# Patient Record
Sex: Female | Born: 1995 | Race: White | Hispanic: No | Marital: Single | Smoking: Current every day smoker
Health system: Southern US, Community
[De-identification: ages and names within clinical notes are randomized; demographics above are authoritative.]

---
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
## 2021-09-20 ENCOUNTER — Emergency Department (HOSPITAL_BASED_OUTPATIENT_CLINIC_OR_DEPARTMENT_OTHER)
Admission: EM | Admit: 2021-09-20 | Discharge: 2021-09-20 | Disposition: A | Discharge status: Home / Self Care | Payer: Self-pay | Attending: Emergency Medicine | Admitting: Emergency Medicine

## 2021-09-20 ENCOUNTER — Other Ambulatory Visit: Payer: Self-pay

## 2021-09-20 ENCOUNTER — Emergency Department (HOSPITAL_BASED_OUTPATIENT_CLINIC_OR_DEPARTMENT_OTHER): Payer: Self-pay

## 2021-09-20 ENCOUNTER — Encounter (HOSPITAL_BASED_OUTPATIENT_CLINIC_OR_DEPARTMENT_OTHER): Payer: Self-pay | Admitting: Emergency Medicine

## 2021-09-20 DIAGNOSIS — Z23 Encounter for immunization: Secondary | ICD-10-CM | POA: Insufficient documentation

## 2021-09-20 DIAGNOSIS — S61412A Laceration without foreign body of left hand, initial encounter: Secondary | ICD-10-CM | POA: Insufficient documentation

## 2021-09-20 DIAGNOSIS — Y990 Civilian activity done for income or pay: Secondary | ICD-10-CM | POA: Insufficient documentation

## 2021-09-20 DIAGNOSIS — W25XXXA Contact with sharp glass, initial encounter: Secondary | ICD-10-CM | POA: Insufficient documentation

## 2021-09-20 MED ORDER — TETANUS-DIPHTH-ACELL PERTUSSIS 5-2.5-18.5 LF-MCG/0.5 IM SUSY
0.5000 mL | PREFILLED_SYRINGE | Freq: Once | INTRAMUSCULAR | Status: AC
  Administered 2021-09-20: 0.5 mL via INTRAMUSCULAR
  Filled 2021-09-20: qty 0.5

## 2021-09-20 MED ORDER — CEPHALEXIN 500 MG PO CAPS: 500.0000 mg | ORAL_CAPSULE | Freq: Three times a day (TID) | ORAL | 0 refills | Status: AC

## 2021-09-20 MED ORDER — LIDOCAINE HCL (PF) 1 % IJ SOLN
30.0000 mL | Freq: Once | INTRAMUSCULAR | Status: AC
  Administered 2021-09-20: 30 mL
  Filled 2021-09-20: qty 30

## 2021-09-20 MED ORDER — IBUPROFEN 400 MG PO TABS
600.0000 mg | ORAL_TABLET | Freq: Once | ORAL | Status: AC
  Administered 2021-09-20: 600 mg via ORAL
  Filled 2021-09-20: qty 1

## 2021-09-20 NOTE — ED Triage Notes (Signed)
Laceration to LT hand with broken glass at work tonight

## 2021-09-20 NOTE — Discharge Instructions (Addendum)
Your x-ray did not show any bony injury, or foreign bodies.  Laceration was repaired with 2 stitches.  These will need to be removed in 7 days.  You can go to your primary care provider, or return to emergency room or any urgent care.  If you have any worsening swelling, drainage, redness, or fever please return for evaluation as these are concerning for infection.

## 2021-09-20 NOTE — ED Provider Notes (Signed)
[facility] EMERGENCY DEPARTMENT Provider Note   CSN: [id number] Arrival date & time: 09/20/21  1940     History  Chief Complaint  Patient presents with   Laceration    Latasha Henderson is a 26 y.o. female.  26 year old female presents today for evaluation of laceration to her left hand.  She states she was putting a glass on the top shelf at work.  The glass struck the metal shelf chest pain, and shattered in her hand.  Denies any other injuries.  She is unsure of her last tetanus shot.  Injury occurred around 6 PM.  The history is provided by the patient. No language interpreter was used.       Home Medications Prior to Admission medications   Not on File      Allergies    Patient has no known allergies.    Review of Systems   Review of Systems  Skin:  Positive for wound.  All other systems reviewed and are negative.   Physical Exam Updated Vital Signs BP 135/74 (BP Location: Right Arm)   Pulse 72   Temp 98.3 F (36.8 C) (Oral)   Resp 16   Ht 5\' 11"  (1.803 m)   Wt 83.9 kg   SpO2 100%   BMI 25.80 kg/m  Physical Exam Vitals and nursing note reviewed.  Constitutional:      General: She is not in acute distress.    Appearance: Normal appearance. She is not ill-appearing.  HENT:     Head: Normocephalic and atraumatic.     Nose: Nose normal.  Eyes:     Conjunctiva/sclera: Conjunctivae normal.  Pulmonary:     Effort: Pulmonary effort is normal. No respiratory distress.  Musculoskeletal:        General: No deformity.  Skin:    Findings: No rash.     Comments: About 1 cm laceration noted between the base of the fifth and the fourth digit on the left hand.  Active bleeding noted.  No foreign bodies noted.  2+ DP pulse present.  Full range of motion in all digits of the left hand.  Brisk cap refill present.  Neurological:     Mental Status: She is alert.     ED Results / Procedures / Treatments   Labs (all labs ordered are listed, but only  abnormal results are displayed) Labs Reviewed - No data to display  EKG None  Radiology DG Hand Complete Left  Result Date: 09/20/2021 CLINICAL DATA:  Left hand laceration EXAM: LEFT HAND - COMPLETE 3+ VIEW COMPARISON:  None Available. FINDINGS: There is no evidence of fracture or dislocation. There is no evidence of arthropathy or other focal bone abnormality. Soft tissues are unremarkable. No retained radiopaque foreign body identified. IMPRESSION: Negative. Electronically Signed   By: 11/20/2021 M.D.   On: 09/20/2021 20:05    Procedures Procedures    Medications Ordered in ED Medications  ibuprofen (ADVIL) tablet 600 mg (has no administration in time range)  lidocaine (PF) (XYLOCAINE) 1 % injection 30 mL (has no administration in time range)  Tdap (BOOSTRIX) injection 0.5 mL (has no administration in time range)    ED Course/ Medical Decision Making/ A&P                           Medical Decision Making Amount and/or Complexity of Data Reviewed Radiology: ordered.  Risk Prescription drug management.   26 year old female presents today for evaluation  of laceration to the left hand at the base of the fifth and fourth digit.  X-ray without evidence of foreign body.  Laceration thoroughly cleaned.  Repaired with 2 stitches.  Symptomatic management discussed.  Return precautions discussed.  Patient voices understanding and is in agreement with plan.  Patient is right-hand dominant.  Neurovascularly intact.   Final Clinical Impression(s) / ED Diagnoses Final diagnoses:  Laceration of left hand without foreign body, initial encounter    Rx / DC Orders ED Discharge Orders          Ordered    cephALEXin (KEFLEX) 500 MG capsule  3 times daily        09/20/21 2209              Marita [place], PA-C 09/20/21 2213    Terald Sleeper, MD 09/20/21 2237

## 2023-02-22 IMAGING — DX DG HAND COMPLETE 3+V*L*
3 series · 3 of 3 positions shown · non-contrast
Comparison: None Available.

CLINICAL DATA: Left hand laceration

EXAM:
LEFT HAND - COMPLETE 3+ VIEW

[hand pa]
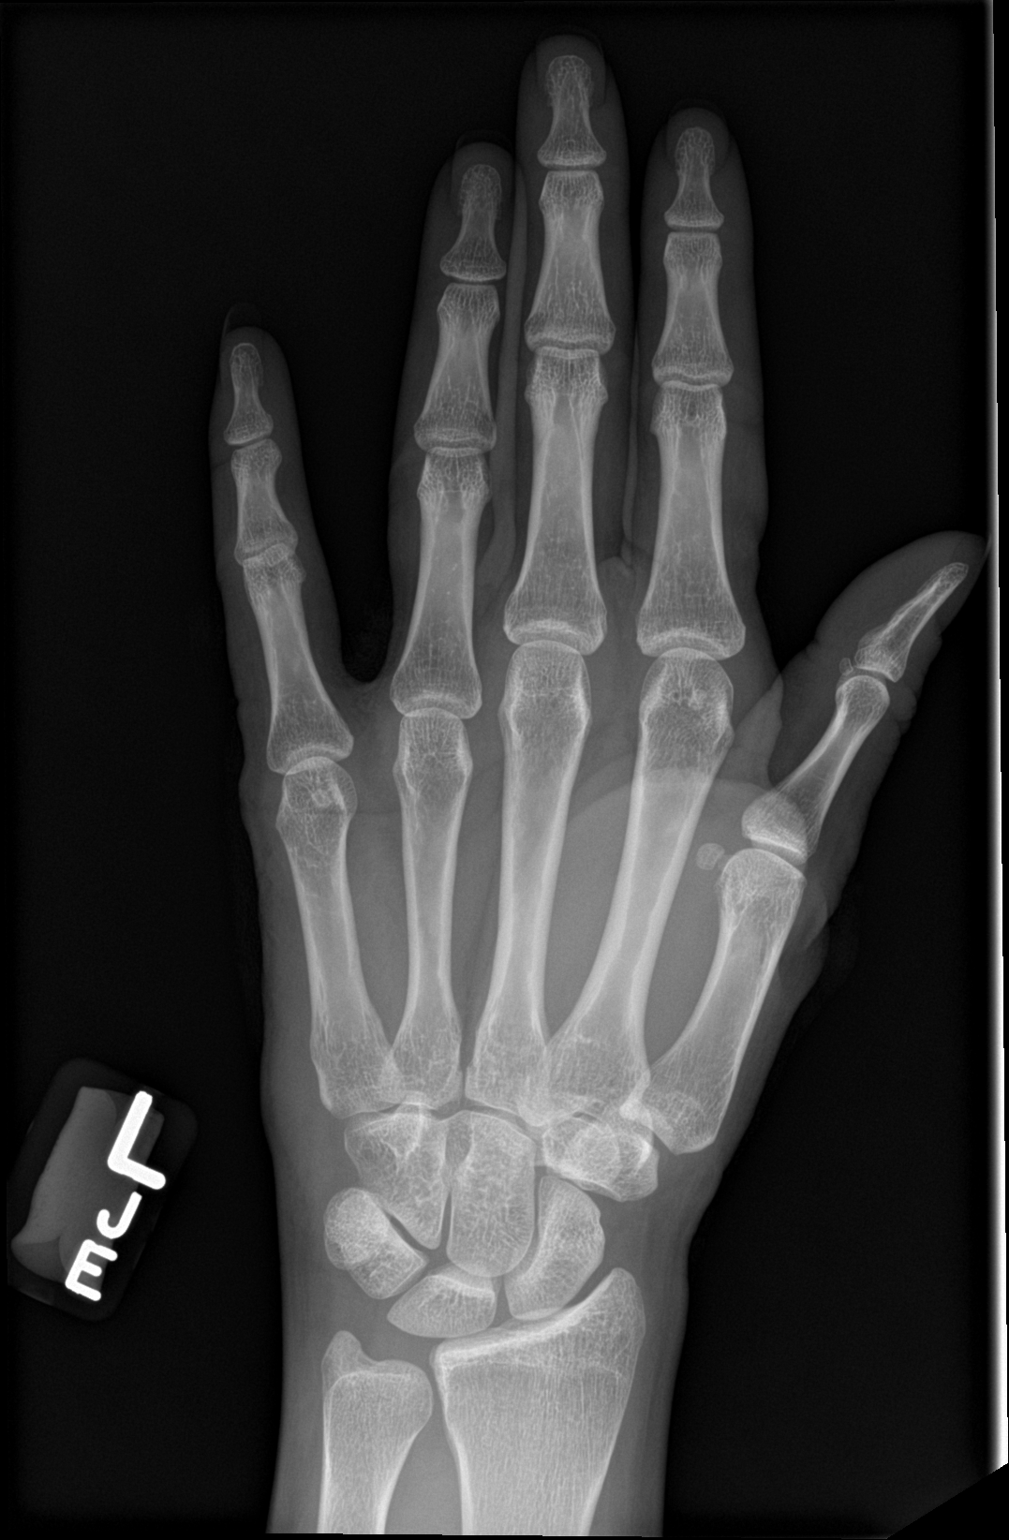

[hand obl]
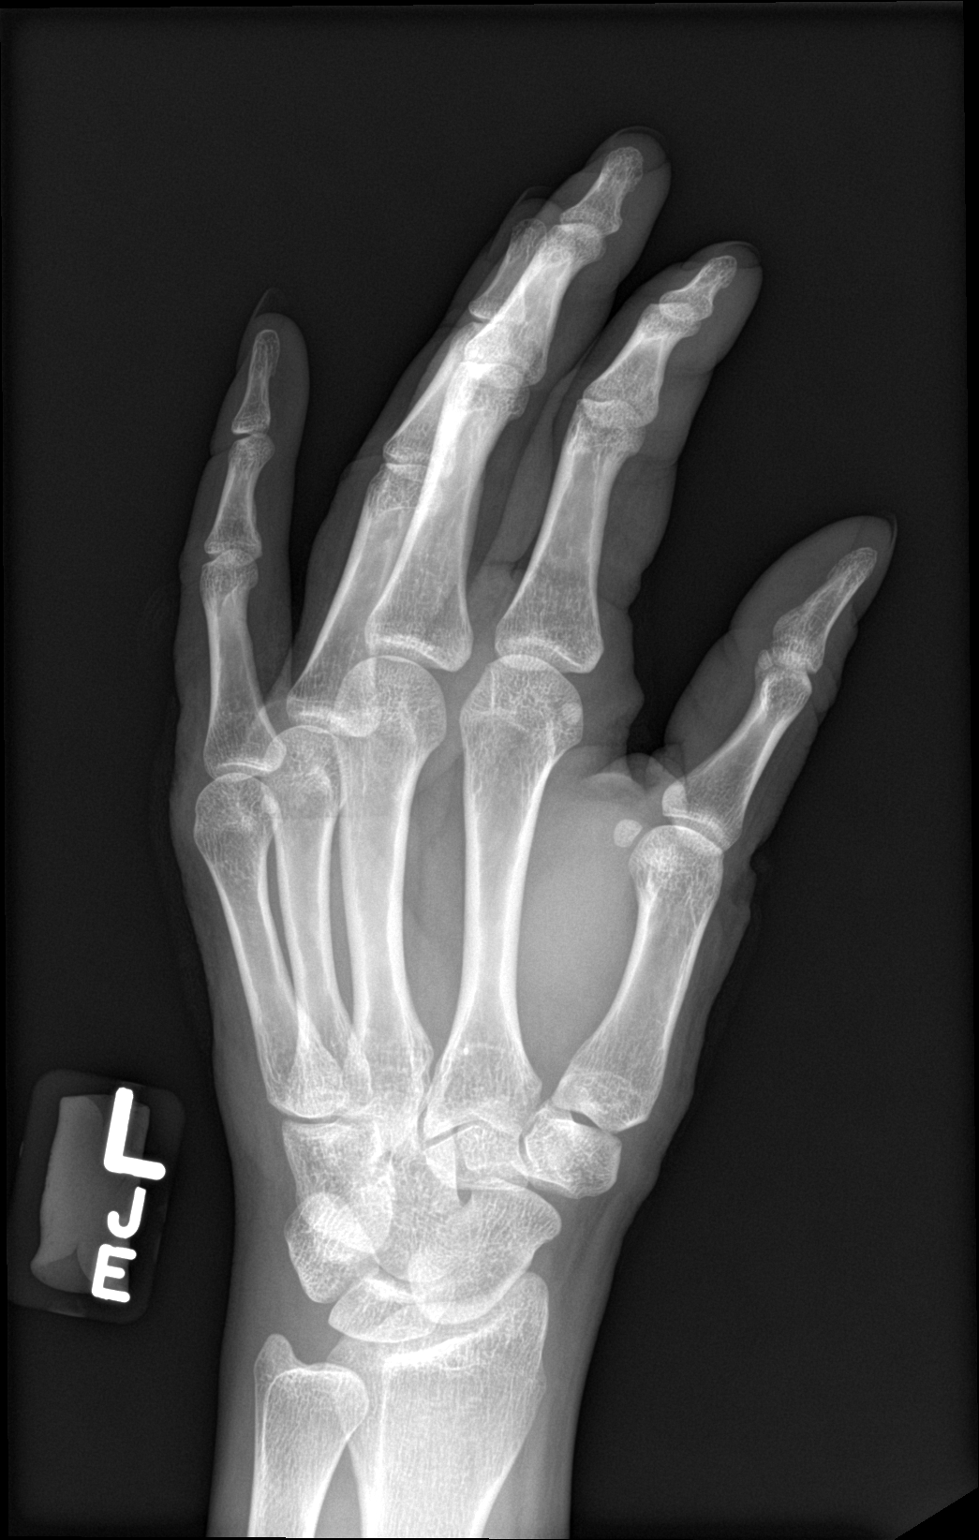

[hand lat]
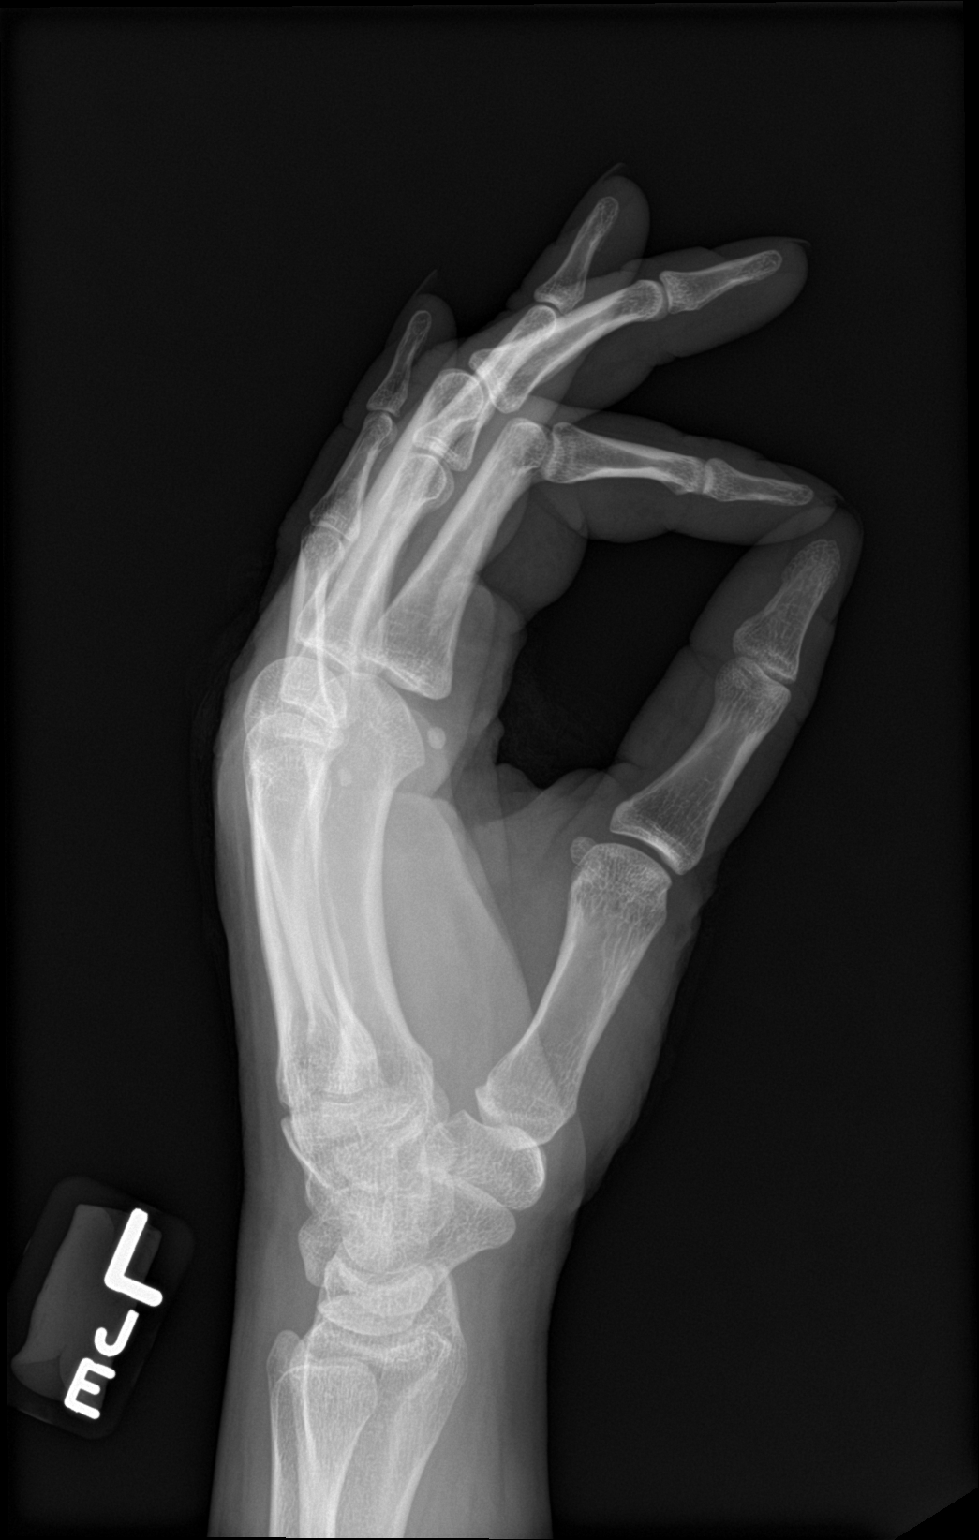

[3 of 3 positions shown; findings below may reference images not displayed]

FINDINGS: There is no evidence of fracture or dislocation. There is no
evidence of arthropathy or other focal bone abnormality. Soft
tissues are unremarkable. No retained radiopaque foreign body
identified.
IMPRESSION: Negative.

## 2023-09-27 ENCOUNTER — Other Ambulatory Visit: Payer: Self-pay

## 2023-09-27 ENCOUNTER — Emergency Department (HOSPITAL_BASED_OUTPATIENT_CLINIC_OR_DEPARTMENT_OTHER)
Admission: EM | Admit: 2023-09-27 | Discharge: 2023-09-27 | Disposition: A | Discharge status: Home / Self Care | Attending: Emergency Medicine | Admitting: Emergency Medicine

## 2023-09-27 ENCOUNTER — Encounter (HOSPITAL_BASED_OUTPATIENT_CLINIC_OR_DEPARTMENT_OTHER): Payer: Self-pay

## 2023-09-27 DIAGNOSIS — S51811A Laceration without foreign body of right forearm, initial encounter: Secondary | ICD-10-CM | POA: Insufficient documentation

## 2023-09-27 DIAGNOSIS — W275XXA Contact with paper-cutter, initial encounter: Secondary | ICD-10-CM | POA: Insufficient documentation

## 2023-09-27 DIAGNOSIS — Z23 Encounter for immunization: Secondary | ICD-10-CM | POA: Insufficient documentation

## 2023-09-27 DIAGNOSIS — S59911A Unspecified injury of right forearm, initial encounter: Secondary | ICD-10-CM | POA: Diagnosis present

## 2023-09-27 MED ORDER — TETANUS-DIPHTH-ACELL PERTUSSIS 5-2.5-18.5 LF-MCG/0.5 IM SUSY
0.5000 mL | PREFILLED_SYRINGE | Freq: Once | INTRAMUSCULAR | Status: AC
  Administered 2023-09-27: 0.5 mL via INTRAMUSCULAR
  Filled 2023-09-27: qty 0.5

## 2023-09-27 MED ORDER — LIDOCAINE-EPINEPHRINE (PF) 2 %-1:200000 IJ SOLN
10.0000 mL | Freq: Once | INTRAMUSCULAR | Status: AC
  Administered 2023-09-27: 10 mL
  Filled 2023-09-27: qty 20

## 2023-09-27 NOTE — Discharge Instructions (Signed)
 As we discussed, please follow-up with your primary care doctor in 7 to 10 days to have your sutures removed.  Keep the area clean and dry, washing with simple soap and water, and apply Neosporin and clean dressings if dressing becomes soiled or wet.  Please return to the emergency room should you start to develop redness, tenderness, or puslike drainage coming out of the wound.

## 2023-09-27 NOTE — ED Provider Notes (Signed)
 [place] [facility] AT [facility] Provider Note   CSN: [id number] Arrival date & time: 09/27/23  1003     Patient presents with: Laceration   Latasha Henderson is a 28 y.o. female who presents with a laceration to the left dorsal forearm after a box cutter slipped from foam she was cutting and cut into her forearm.  She denies any numbness or tingling in the associated fingers or hand, bleeding is controlled prior to arrival to the emergency room with direct pressure.  She is uncertain as to when her last tetanus vaccine was.  No other injuries appreciated.    Laceration      Prior to Admission medications   Not on File    Allergies: Patient has no known allergies.    Review of Systems  Skin:  Positive for wound.  All other systems reviewed and are negative.   Updated Vital Signs BP 107/62   Pulse (!) 57   Resp 16   Wt 71.2 kg   SpO2 99%   BMI 21.90 kg/m   Physical Exam Vitals and nursing note reviewed.  Constitutional:      General: She is not in acute distress.    Appearance: She is well-developed.  HENT:     Head: Normocephalic and atraumatic.   Eyes:     Conjunctiva/sclera: Conjunctivae normal.    Cardiovascular:     Rate and Rhythm: Normal rate and regular rhythm.     Heart sounds: No murmur heard. Pulmonary:     Effort: Pulmonary effort is normal. No respiratory distress.     Breath sounds: Normal breath sounds.  Abdominal:     Palpations: Abdomen is soft.     Tenderness: There is no abdominal tenderness.   Musculoskeletal:        General: No swelling.     Cervical back: Neck supple.   Skin:    General: Skin is warm and dry.     Capillary Refill: Capillary refill takes less than 2 seconds.     Findings: Laceration present.     Comments: Approximately 2 cm linear laceration to the dorsal left forearm.  Noted only extends into the subcutaneous layer, does not enter into the fascia or muscular layer.  Hemostasis achieved  prior to arrival with direct pressure.   Neurological:     Mental Status: She is alert.   Psychiatric:        Mood and Affect: Mood normal.     (all labs ordered are listed, but only abnormal results are displayed) Labs Reviewed - No data to display  EKG: None  Radiology: No results found.   .Laceration Repair  Date/Time: 09/27/2023 11:12 AM  Performed by: Juanetta Nordmann, PA Authorized by: Juanetta Nordmann, PA   Consent:    Consent obtained:  Verbal   Consent given by:  Patient   Risks, benefits, and alternatives were discussed: yes     Risks discussed:  Infection, pain, retained foreign body, tendon damage, poor cosmetic result, need for additional repair, poor wound healing, nerve damage and vascular damage   Alternatives discussed:  No treatment Universal protocol:    Procedure explained and questions answered to patient or proxy's satisfaction: yes     Patient identity confirmed:  Verbally with patient, arm band and hospital-assigned identification number Anesthesia:    Anesthesia method:  Local infiltration   Local anesthetic:  Lidocaine  2% WITH epi Laceration details:    Location:  Shoulder/arm   Shoulder/arm location:  L lower arm   Length (cm):  2   Depth (mm):  2 Pre-procedure details:    Preparation:  Patient was prepped and draped in usual sterile fashion Exploration:    Limited defect created (wound extended): no     Hemostasis achieved with:  Direct pressure   Wound exploration: wound explored through full range of motion and entire depth of wound visualized     Wound extent: areolar tissue not violated, fascia not violated, no foreign body, no signs of injury, no nerve damage and no tendon damage     Contaminated: no   Treatment:    Area cleansed with:  Povidone-iodine   Amount of cleaning:  Standard   Irrigation solution:  Sterile saline   Irrigation volume:  200   Irrigation method:  Pressure wash   Debridement:  None   Undermining:   None   Scar revision: no   Skin repair:    Repair method:  Sutures   Suture size:  4-0   Suture material:  Prolene   Suture technique:  Simple interrupted   Number of sutures:  4 Approximation:    Approximation:  Close Repair type:    Repair type:  Simple Post-procedure details:    Dressing:  Antibiotic ointment, non-adherent dressing and bulky dressing   Procedure completion:  Tolerated well, no immediate complications    Medications Ordered in the ED  lidocaine -EPINEPHrine (XYLOCAINE  W/EPI) 2 %-1:200000 (PF) injection 10 mL (10 mLs Infiltration Given 09/27/23 1047)  Tdap (BOOSTRIX ) injection 0.5 mL (0.5 mLs Intramuscular Given 09/27/23 1048)                                    Medical Decision Making Risk Prescription drug management.   /Medical Decision Making:   Latasha Henderson is a 28 y.o. female who presented to the ED today with laceration to the left forearm detailed above.     Complete initial physical exam performed, notably the patient  was alert and oriented in no apparent distress.  She has a approximately 2 cm linear laceration to the left dorsal forearm.  Hemostasis achieved prior to arrival, no distal numbness or tingling appreciated and neurovascular function is intact bilaterally.     Reviewed and confirmed nursing documentation for past medical history, family history, social history.    Initial Assessment:   With the patient's presentation of forearm laceration, most likely diagnosis is laceration.    Initial Plan:  Flushed wound with normal saline Provide tetanus prophylaxis Close wound as noted and wound care procedure note. Based on exam and clinical history, further workup deferred at this time. Objective evaluation as below reviewed   Reassessment and Plan:   As noted in the procedure note, wound was closed without any complications, bandaged and cleaned as noted, directed to follow-up with primary care/urgent care within the next of 10 days for  suture removal.  Return precautions given.       Final diagnoses:  Laceration of right forearm, initial encounter    ED Discharge Orders     None          Juanetta Nordmann, [place] 09/27/23 1115    Long, Shereen Dike, MD 09/28/23 [phone]

## 2023-09-27 NOTE — ED Triage Notes (Signed)
 Pt presents with laceration to left lower arm. Cut arm while slicing through foam
